# Patient Record
Sex: Male | Born: 1985 | Race: White | Hispanic: No | Marital: Married | Smoking: Never smoker
Health system: Southern US, Community
[De-identification: ages and names within clinical notes are randomized; demographics above are authoritative.]

---

## 2005-11-16 ENCOUNTER — Emergency Department: Payer: Self-pay | Admitting: Unknown Physician Specialty

## 2006-10-29 ENCOUNTER — Emergency Department: Payer: Self-pay | Admitting: Emergency Medicine

## 2009-07-08 ENCOUNTER — Emergency Department: Payer: Self-pay | Admitting: Emergency Medicine

## 2009-09-08 ENCOUNTER — Ambulatory Visit: Payer: Self-pay | Admitting: Internal Medicine

## 2011-10-10 ENCOUNTER — Emergency Department: Payer: Self-pay | Admitting: Emergency Medicine

## 2015-09-20 ENCOUNTER — Encounter: Payer: Self-pay | Admitting: Emergency Medicine

## 2015-09-20 ENCOUNTER — Ambulatory Visit
Admission: EM | Admit: 2015-09-20 | Discharge: 2015-09-20 | Disposition: A | Discharge status: Home / Self Care | Payer: Worker's Compensation | Attending: Emergency Medicine | Admitting: Emergency Medicine

## 2015-09-20 DIAGNOSIS — M62830 Muscle spasm of back: Secondary | ICD-10-CM

## 2015-09-20 DIAGNOSIS — S29012A Strain of muscle and tendon of back wall of thorax, initial encounter: Secondary | ICD-10-CM

## 2015-09-20 MED ORDER — IBUPROFEN 800 MG PO TABS: 800.0000 mg | ORAL_TABLET | Freq: Three times a day (TID) | ORAL | 0 refills | Status: DC | PRN

## 2015-09-20 MED ORDER — METAXALONE 800 MG PO TABS: 800.0000 mg | ORAL_TABLET | Freq: Three times a day (TID) | ORAL | 0 refills | Status: DC

## 2015-09-20 NOTE — ED Triage Notes (Signed)
Patient states that at work he lifted a bar weighing 50 lb. last week and felt pain in his mid back.

## 2015-09-20 NOTE — ED Provider Notes (Signed)
HPI  SUBJECTIVE:  Ethan Cobb is a 30 y.o. male who presents with left-sided nonradiating upper back pain starting 6 days ago. Describes pain as throbbing, constant. States it started while he was doing a workout for PT. States that he just finished an upper body workout with upper back rows, was bending over to pick up another heavy weight, and states that he felt something "move or pull". Symptoms are worse with leaning back, reaching forward and pulling back with his left arm. No alleviating factors. He has tried rest, heating pad, stretching and ibuprofen 400 mg without improvement. He states his shoulder is "okay". He denies numbness or tingling in his arm, grip weakness, swelling, erythema, rash or direct trauma to his neck or back. No antipyretic in the past 6-8 hours. Past medical history negative for cancer, multiple myeloma, history of injury to his neck or back, diabetes, hypertension. PMD: Forgot name. He is a Education officer, museum  History reviewed. No pertinent past medical history.  History reviewed. No pertinent surgical history.  History reviewed. No pertinent family history.  Social History  Substance Use Topics  . Smoking status: Never Smoker  . Smokeless tobacco: Never Used  . Alcohol use No    No current facility-administered medications for this encounter.   Current Outpatient Prescriptions:  .  ibuprofen (ADVIL,MOTRIN) 800 MG tablet, Take 1 tablet (800 mg total) by mouth every 8 (eight) hours as needed., Disp: 30 tablet, Rfl: 0 .  metaxalone (SKELAXIN) 800 MG tablet, Take 1 tablet (800 mg total) by mouth 3 (three) times daily., Disp: 21 tablet, Rfl: 0  No Known Allergies   ROS  As noted in HPI.   Physical Exam  BP 114/74 (BP Location: Right Arm)   Pulse 85   Temp 98.4 F (36.9 C) (Tympanic)   Resp 16   SpO2 98%   Constitutional: Well developed, well nourished, no acute distress Eyes:  EOMI, conjunctiva normal bilaterally HENT: Normocephalic,  atraumatic,mucus membranes moist Respiratory: Normal inspiratory effort Cardiovascular: Normal rate GI: nondistended. No suprapubic tenderness skin: No rash, skin intact Musculoskeletal: Positive left rhomboid tenderness, muscle spasm. No tenderness or muscle spasm along the left trapezius. No C-spine, T-spine tenderness. No scapular tenderness. No bony tenderness along the shoulder. Shoulder full range of motion intact.  grip strength, flexion and extension at elbow equal bilaterally. Brachioradialis reflexes 2+ and equal bilaterally. Cap refill less than 2 seconds. RP 2+.  Neurologic: Alert & oriented x 3, no focal neuro deficits Psychiatric: Speech and behavior appropriate   ED Course   Medications - No data to display  No orders of the defined types were placed in this encounter.   No results found for this or any previous visit (from the past 24 hour(s)). No results found.  ED Clinical Impression  Rhomboid muscle strain, initial encounter  Muscle spasm of back   ED Assessment/Plan  [place] narcotic database reviewed. Pt with no narcotic rx in the past 6 months.  No bony tenderness, no red flags on H&P. Deferring imaging at this time. Presentation is consistent with rhomboid muscle strain/sprain with some muscle spasm. Home with muscle relaxant, ibuprofen 800 mg 3 times a day with 1 g of Tylenol. Advised deep tissue massage. He declined prescription of tramadol. Follow up with Tommie Homero Fellers, FNP at [facility] health if no better in a week to 10 days. Discussed medical decision-making, and plan for follow-up with the patient.   Patient agrees with plan.   *This clinic note was created  using [employer]. Therefore, there may be occasional mistakes despite careful proofreading.  ?    Melynda Ripple, MD 09/20/15 [phone]

## 2016-07-13 ENCOUNTER — Encounter: Payer: Self-pay | Admitting: *Deleted

## 2016-07-13 ENCOUNTER — Emergency Department: Payer: 59

## 2016-07-13 ENCOUNTER — Emergency Department
Admission: EM | Admit: 2016-07-13 | Discharge: 2016-07-13 | Disposition: A | Discharge status: Home / Self Care | Payer: 59 | Attending: Emergency Medicine | Admitting: Emergency Medicine

## 2016-07-13 DIAGNOSIS — Z79899 Other long term (current) drug therapy: Secondary | ICD-10-CM | POA: Diagnosis not present

## 2016-07-13 DIAGNOSIS — M25512 Pain in left shoulder: Secondary | ICD-10-CM

## 2016-07-13 DIAGNOSIS — Y929 Unspecified place or not applicable: Secondary | ICD-10-CM | POA: Insufficient documentation

## 2016-07-13 DIAGNOSIS — Y9389 Activity, other specified: Secondary | ICD-10-CM | POA: Diagnosis not present

## 2016-07-13 DIAGNOSIS — S4992XA Unspecified injury of left shoulder and upper arm, initial encounter: Secondary | ICD-10-CM | POA: Diagnosis present

## 2016-07-13 DIAGNOSIS — Y999 Unspecified external cause status: Secondary | ICD-10-CM | POA: Insufficient documentation

## 2016-07-13 DIAGNOSIS — M75102 Unspecified rotator cuff tear or rupture of left shoulder, not specified as traumatic: Secondary | ICD-10-CM | POA: Insufficient documentation

## 2016-07-13 DIAGNOSIS — M7582 Other shoulder lesions, left shoulder: Secondary | ICD-10-CM

## 2016-07-13 DIAGNOSIS — W010XXA Fall on same level from slipping, tripping and stumbling without subsequent striking against object, initial encounter: Secondary | ICD-10-CM | POA: Diagnosis not present

## 2016-07-13 MED ORDER — IBUPROFEN 800 MG PO TABS: 800.0000 mg | ORAL_TABLET | Freq: Three times a day (TID) | ORAL | 0 refills | Status: DC | PRN

## 2016-07-13 MED ORDER — TRAMADOL HCL 50 MG PO TABS: 50.0000 mg | ORAL_TABLET | Freq: Four times a day (QID) | ORAL | 0 refills | Status: DC | PRN

## 2016-07-13 MED ORDER — KETOROLAC TROMETHAMINE 30 MG/ML IJ SOLN
30.0000 mg | Freq: Once | INTRAMUSCULAR | Status: AC
  Administered 2016-07-13: 30 mg via INTRAMUSCULAR
  Filled 2016-07-13: qty 1

## 2016-07-13 NOTE — Discharge Instructions (Signed)
Please rest and ice the left shoulder. Use a sling as needed for 2-3 days for comfort. Call orthopedics tomorrow to schedule follow-up appointment. Return to the ER for any worsening symptoms urgent changes in her health.

## 2016-07-13 NOTE — ED Provider Notes (Signed)
[facility]-EMERGENCY DEPARTMENT Provider Note   CSN: [id number] Arrival date & time: 07/13/16  2055     History   Chief Complaint Chief Complaint  Patient presents with  . Shoulder Injury    HPI Ethan Cobb is a 31 y.o. male  Presents to the emergency department for evaluation of left shoulder pain. Patient states he slipped on the grass, landed on his left lateral shoulder. Pain is located over the lateral shoulder. He felt a pop, initially had inability to abduct the shoulder but now is able to do so but with pain. He denies any head injury, neck pain. No loss of consciousness. Patient has taken Tylenol prior to arrival his pain continues to be needed at 10. No numbness or tingling in the left upper extremity. He is left-hand dominant. He is a Emergency planning/management officerpolice officer.  HPI  History reviewed. No pertinent past medical history.  There are no active problems to display for this patient.   History reviewed. No pertinent surgical history.     Home Medications    Prior to Admission medications   Medication Sig Start Date End Date Taking? Authorizing Provider  ibuprofen (ADVIL,MOTRIN) 800 MG tablet Take 1 tablet (800 mg total) by mouth every 8 (eight) hours as needed. 07/13/16   Evon SlackGaines, Shine Scrogham C, PA-C  metaxalone (SKELAXIN) 800 MG tablet Take 1 tablet (800 mg total) by mouth 3 (three) times daily. 09/20/15   Domenick GongMortenson, Ashley, MD  traMADol (ULTRAM) 50 MG tablet Take 1 tablet (50 mg total) by mouth every 6 (six) hours as needed. 07/13/16   Evon SlackGaines, Yovany Clock C, PA-C    Family History History reviewed. No pertinent family history.  Social History Social History  Substance Use Topics  . Smoking status: Never Smoker  . Smokeless tobacco: Never Used  . Alcohol use No     Allergies   Patient has no known allergies.   Review of Systems Review of Systems  Respiratory: Negative for shortness of breath.   Musculoskeletal: Positive for arthralgias. Negative for neck pain.  Skin: Negative for  rash and wound.  Neurological: Negative for dizziness, light-headedness, numbness and headaches.  Psychiatric/Behavioral: Negative for agitation.     Physical Exam Updated Vital Signs BP 135/81 (BP Location: Right Arm)   Pulse 78   Temp 98.3 F (36.8 C) (Oral)   Resp 18   Ht 5\' 7"  (1.702 m)   Wt 78.9 kg (174 lb)   SpO2 99%   BMI 27.25 kg/m   Physical Exam  Constitutional: He appears well-developed and well-nourished.  HENT:  Head: Normocephalic and atraumatic.  Neck: Normal range of motion.  Cardiovascular: Normal rate and intact distal pulses.   Pulmonary/Chest: No respiratory distress.  Musculoskeletal:  Examination of the left upper extremity shows patient has full active range of motion with abduction and flexion. He has pain with flexion and abduction greater than 90. He has painful internal and external rotation. Positive Hawkins and impingement sign. He is minimally tender along the distal clavicle.     ED Treatments / Results  Labs (all labs ordered are listed, but only abnormal results are displayed) Labs Reviewed - No data to display  EKG  EKG Interpretation None       Radiology Dg Shoulder Left  Result Date: 07/13/2016 CLINICAL DATA:  Left shoulder pain after fall while playing with children. EXAM: LEFT SHOULDER - 2+ VIEW COMPARISON:  None. FINDINGS: There is no evidence of fracture or dislocation. There is no evidence of arthropathy or other focal bone  abnormality. The adjacent included ribs and lung are unremarkable. The scapula appears intact. Soft tissues are unremarkable. IMPRESSION: Negative. Electronically Signed   By: Tollie Eth M.D.   On: 07/13/2016 21:40    Procedures Procedures (including critical care time) SPLINT APPLICATION Date/Time: 9:57 PM Authorized by: Patience Musca Consent: Verbal consent obtained. Risks and benefits: risks, benefits and alternatives were discussed Consent given by: patient Splint applied by: ED  tech Location details:  Left shoulder Splint type:  Splint sling Supplies used:  sling Post-procedure: The splinted body part was neurovascularly unchanged following the procedure. Patient tolerance: Patient tolerated the procedure well with no immediate complications.     Medications Ordered in ED Medications  ketorolac (TORADOL) 30 MG/ML injection 30 mg (30 mg Intramuscular Given 07/13/16 2152)     Initial Impression / Assessment and Plan / ED Course  I have reviewed the triage vital signs and the nursing notes.  Pertinent labs & imaging results that were available during my care of the patient were reviewed by me and considered in my medical decision making (see chart for details).      31 year old male with left shoulder pain from fall. X-ray show no evidence of acute bony abnormality or dislocation. Concern for subluxation, rotator cuff impingement. Placed into a sling for a few days, place on anti-inflammatory medications and patient follow-up with orthopedics for repeat evaluation next week. He is given a note with work restrictions.  Final Clinical Impressions(s) / ED Diagnoses   Final diagnoses:  Acute pain of left shoulder  Rotator cuff tendonitis, left    New Prescriptions New Prescriptions   IBUPROFEN (ADVIL,MOTRIN) 800 MG TABLET    Take 1 tablet (800 mg total) by mouth every 8 (eight) hours as needed.   TRAMADOL (ULTRAM) 50 MG TABLET    Take 1 tablet (50 mg total) by mouth every 6 (six) hours as needed.     Evon Slack, PA-C 07/13/16 2202    Merrily Brittle, MD 07/13/16 2223

## 2016-07-13 NOTE — ED Triage Notes (Signed)
Pt to ED reporting having fallen on left shoulder while playing with children. Pt reports haven fallen on grass. Pain reported in shoulder that increases with movement. No deformity noted. No swelling or bruising noted.

## 2017-03-07 ENCOUNTER — Ambulatory Visit: Payer: Self-pay | Admitting: Medical

## 2017-03-07 ENCOUNTER — Encounter: Payer: Self-pay | Admitting: Medical

## 2017-03-07 VITALS — BP 110/72 | HR 79 | Temp 98.6°F | Resp 18 | Ht 67.0 in | Wt 191.8 lb

## 2017-03-07 DIAGNOSIS — J0100 Acute maxillary sinusitis, unspecified: Secondary | ICD-10-CM

## 2017-03-07 DIAGNOSIS — H6691 Otitis media, unspecified, right ear: Secondary | ICD-10-CM

## 2017-03-07 MED ORDER — AMOXICILLIN-POT CLAVULANATE 875-125 MG PO TABS: 1.0000 | ORAL_TABLET | Freq: Two times a day (BID) | ORAL | 0 refills | Status: DC

## 2017-03-07 NOTE — Progress Notes (Signed)
   Subjective:    Patient ID: Ethan PimpleMatthew Cobb, male    DOB: 01/12/1986, 32 y.o.   MRN: [id number]  HPI  32 yo male in non acute distress with symptoms starting yesterday with pressure in head,  Nasal discharge green , cough productive green "alittle amount of mucus"body aches , sore throat ,  Pressure behind ears.  Has been sweating but denies fever or chills. Today took Ibuprofen 800mg   9am.   33 yo twin with vomiting Monday. Feeling better today.  Review of Systems  Constitutional: Positive for fatigue. Negative for chills and fever.  HENT: Positive for postnasal drip, rhinorrhea, sinus pressure, sinus pain, sneezing, sore throat and voice change (raspy). Negative for congestion and ear pain.   Respiratory: Positive for cough. Negative for shortness of breath.   Cardiovascular: Negative for chest pain.  Gastrointestinal: Positive for abdominal pain (central abdomen). Negative for blood in stool, constipation, diarrhea, nausea and vomiting.  Genitourinary: Negative for dysuria.  Musculoskeletal: Positive for myalgias.  Skin: Positive for rash.  Allergic/Immunologic: Negative for environmental allergies.  Neurological: Positive for headaches. Negative for dizziness, syncope and light-headedness.       Objective:   Physical Exam  Constitutional: He is oriented to person, place, and time. He appears well-developed and well-nourished.  HENT:  Head: Normocephalic and atraumatic.  Right Ear: Hearing, external ear and ear canal normal. Tympanic membrane is erythematous.  Left Ear: Hearing, external ear and ear canal normal. A middle ear effusion is present.  Nose: Mucosal edema and rhinorrhea present.  Mouth/Throat: Uvula is midline, oropharynx is clear and moist and mucous membranes are normal.  Eyes: Conjunctivae and EOM are normal. Pupils are equal, round, and reactive to light.  Neck: Normal range of motion. Neck supple.  Cardiovascular: Normal rate, regular rhythm and normal heart  sounds.  Pulmonary/Chest: Effort normal and breath sounds normal.  Lymphadenopathy:    He has cervical adenopathy.  Neurological: He is alert and oriented to person, place, and time.  Skin: Skin is warm and dry.  Psychiatric: He has a normal mood and affect. His behavior is normal. Judgment and thought content normal.  Nursing note and vitals reviewed.    Green discharge noted in right nare, erythema inside  both nares  And turbinate edema on the left side.     Assessment & Plan:  Sinusitis/ right otitis media Meds ordered this encounter  Medications  . amoxicillin-clavulanate (AUGMENTIN) 875-125 MG tablet    Sig: Take 1 tablet by mouth 2 (two) times daily.    Dispense:  20 tablet    Refill:  0  Rest , fluids , OTC Motrin take as directed.  Return in 3-5 days if not improving . Patient verbalizes understanding and has no questions at discharge.

## 2017-03-07 NOTE — Patient Instructions (Signed)
Otitis Media, Adult Otitis media is redness, soreness, and puffiness (swelling) in the space just behind your eardrum (middle ear). It may be caused by allergies or infection. It often happens along with a cold. Follow these instructions at home:  Take your medicine as told. Finish it even if you start to feel better.  Only take over-the-counter or prescription medicines for pain, discomfort, or fever as told by your doctor.  Follow up with your doctor as told. Contact a doctor if:  You have otitis media only in one ear, or bleeding from your nose, or both.  You notice a lump on your neck.  You are not getting better in 3-5 days.  You feel worse instead of better. Get help right away if:  You have pain that is not helped with medicine.  You have puffiness, redness, or pain around your ear.  You get a stiff neck.  You cannot move part of your face (paralysis).  You notice that the bone behind your ear hurts when you touch it. This information is not intended to replace advice given to you by your health care provider. Make sure you discuss any questions you have with your health care provider. Document Released: 07/12/2007 Document Revised: 07/01/2015 Document Reviewed: 08/20/2012 Elsevier Interactive Patient Education  2017 Elsevier Inc. Sinusitis, Adult Sinusitis is soreness and inflammation of your sinuses. Sinuses are hollow spaces in the bones around your face. They are located:  Around your eyes.  In the middle of your forehead.  Behind your nose.  In your cheekbones.  Your sinuses and nasal passages are lined with a stringy fluid (mucus). Mucus normally drains out of your sinuses. When your nasal tissues get inflamed or swollen, the mucus can get trapped or blocked so air cannot flow through your sinuses. This lets bacteria, viruses, and funguses grow, and that leads to infection. Follow these instructions at home: Medicines  Take, use, or apply over-the-counter  and prescription medicines only as told by your doctor. These may include nasal sprays.  If you were prescribed an antibiotic medicine, take it as told by your doctor. Do not stop taking the antibiotic even if you start to feel better. Hydrate and Humidify  Drink enough water to keep your pee (urine) clear or pale yellow.  Use a cool mist humidifier to keep the humidity level in your home above 50%.  Breathe in steam for 10-15 minutes, 3-4 times a day or as told by your doctor. You can do this in the bathroom while a hot shower is running.  Try not to spend time in cool or dry air. Rest  Rest as much as possible.  Sleep with your head raised (elevated).  Make sure to get enough sleep each night. General instructions  Put a warm, moist washcloth on your face 3-4 times a day or as told by your doctor. This will help with discomfort.  Wash your hands often with soap and water. If there is no soap and water, use hand sanitizer.  Do not smoke. Avoid being around people who are smoking (secondhand smoke).  Keep all follow-up visits as told by your doctor. This is important. Contact a doctor if:  You have a fever.  Your symptoms get worse.  Your symptoms do not get better within 10 days. Get help right away if:  You have a very bad headache.  You cannot stop throwing up (vomiting).  You have pain or swelling around your face or eyes.  You have trouble   seeing.  You feel confused.  Your neck is stiff.  You have trouble breathing. This information is not intended to replace advice given to you by your health care provider. Make sure you discuss any questions you have with your health care provider. Document Released: 07/12/2007 Document Revised: 09/19/2015 Document Reviewed: 11/18/2014 Elsevier Interactive Patient Education  2018 Elsevier Inc.  

## 2017-12-27 ENCOUNTER — Emergency Department
Admission: EM | Admit: 2017-12-27 | Discharge: 2017-12-27 | Disposition: A | Discharge status: Home / Self Care | Payer: BLUE CROSS/BLUE SHIELD | Attending: Emergency Medicine | Admitting: Emergency Medicine

## 2017-12-27 ENCOUNTER — Emergency Department: Payer: BLUE CROSS/BLUE SHIELD

## 2017-12-27 ENCOUNTER — Other Ambulatory Visit: Payer: Self-pay

## 2017-12-27 DIAGNOSIS — Y998 Other external cause status: Secondary | ICD-10-CM | POA: Diagnosis not present

## 2017-12-27 DIAGNOSIS — S79911A Unspecified injury of right hip, initial encounter: Secondary | ICD-10-CM | POA: Diagnosis present

## 2017-12-27 DIAGNOSIS — W010XXA Fall on same level from slipping, tripping and stumbling without subsequent striking against object, initial encounter: Secondary | ICD-10-CM | POA: Insufficient documentation

## 2017-12-27 DIAGNOSIS — Z79899 Other long term (current) drug therapy: Secondary | ICD-10-CM | POA: Diagnosis not present

## 2017-12-27 DIAGNOSIS — Y92019 Unspecified place in single-family (private) house as the place of occurrence of the external cause: Secondary | ICD-10-CM | POA: Diagnosis not present

## 2017-12-27 DIAGNOSIS — S7001XA Contusion of right hip, initial encounter: Secondary | ICD-10-CM | POA: Insufficient documentation

## 2017-12-27 DIAGNOSIS — Y9389 Activity, other specified: Secondary | ICD-10-CM | POA: Diagnosis not present

## 2017-12-27 MED ORDER — KETOROLAC TROMETHAMINE 30 MG/ML IJ SOLN
30.0000 mg | Freq: Once | INTRAMUSCULAR | Status: AC
  Administered 2017-12-27: 30 mg via INTRAMUSCULAR
  Filled 2017-12-27: qty 1

## 2017-12-27 MED ORDER — MELOXICAM 15 MG PO TABS: 15.0000 mg | ORAL_TABLET | Freq: Every day | ORAL | 0 refills | Status: DC

## 2017-12-27 NOTE — ED Provider Notes (Signed)
[facility] Emergency Department Provider Note  ____________________________________________  Time seen: Approximately 7:46 PM  I have reviewed the triage vital signs and the nursing notes.   HISTORY  Chief Complaint Leg Pain and Fall    HPI Ethan Cobb is a 32 y.o. male who presents emergency department complaining of right hip pain.  Patient reports that he was at his house, which was just partially renovated.  He reports that the the ER his money so they were using wooden pallets for a  walkway.  He reports that with him overnight moisture, cold temperatures a layer of ice had formed.  When walking, his leg slid out from underneath him, causing him to fall landing directly on the right hip.  Patient reports that he has been ambulatory on the hip but doing so increases his pain.  Pain is located in the lateral hip.  No groin pain.  No shortening or rotation of his leg.  Patient denies any radicular symptoms.  No knee or lower back injury.  Patient did not hit his head or lose consciousness.  No other complaints at this time.   History reviewed. No pertinent past medical history.  There are no active problems to display for this patient.   History reviewed. No pertinent surgical history.  Prior to Admission medications   Medication Sig Start Date End Date Taking? Authorizing Provider  amoxicillin-clavulanate (AUGMENTIN) 875-125 MG tablet Take 1 tablet by mouth 2 (two) times daily. 03/07/17   Ratcliffe, Heather R, PA-C  ibuprofen (ADVIL,MOTRIN) 800 MG tablet Take 1 tablet (800 mg total) by mouth every 8 (eight) hours as needed. 07/13/16   Evon Slack, PA-C  meloxicam (MOBIC) 15 MG tablet Take 1 tablet (15 mg total) by mouth daily. 12/27/17   Bryah Ocheltree, Delorise Royals, PA-C  Pseudoeph-Doxylamine-DM-APAP (NYQUIL PO) Take by mouth.    [provider]  Pseudoephedrine-APAP-DM (DAYQUIL PO) Take by mouth.    [provider]    Allergies Patient  has no known allergies.  No family history on file.  Social History Social History   Tobacco Use  . Smoking status: Never Smoker  . Smokeless tobacco: Current User    Types: Chew  Substance Use Topics  . Alcohol use: No  . Drug use: No     Review of Systems  Constitutional: No fever/chills Eyes: No visual changes. No discharge ENT: No upper respiratory complaints. Cardiovascular: no chest pain. Respiratory: no cough. No SOB. Gastrointestinal: No abdominal pain.  No nausea, no vomiting.  Musculoskeletal: Positive for right hip pain Skin: Negative for rash, abrasions, lacerations, ecchymosis. Neurological: Negative for headaches, focal weakness or numbness. 10-point ROS otherwise negative.  ____________________________________________   PHYSICAL EXAM:  VITAL SIGNS: ED Triage Vitals  Enc Vitals Group     BP 12/27/17 1745 126/69     Pulse Rate 12/27/17 1745 68     Resp 12/27/17 1745 18     Temp 12/27/17 1745 98.7 F (37.1 C)     Temp src --      SpO2 12/27/17 1745 99 %     Weight 12/27/17 1744 180 lb (81.6 kg)     Height 12/27/17 1744 5\' 7"  (1.702 m)     Head Circumference --      Peak Flow --      Pain Score 12/27/17 1743 7     Pain Loc --      Pain Edu? --      Excl. in GC? --  Constitutional: Alert and oriented. Well appearing and in no acute distress. Eyes: Conjunctivae are normal. PERRL. EOMI. Head: Atraumatic. Neck: No stridor.    Cardiovascular: Normal rate, regular rhythm. Normal S1 and S2.  Good peripheral circulation. Respiratory: Normal respiratory effort without tachypnea or retractions. Lungs CTAB. Good air entry to the bases with no decreased or absent breath sounds. Musculoskeletal: Full range of motion to all extremities. No gross deformities appreciated.  No visible abnormality to the right hip on inspection.  No shortening or rotation of the hip/right leg.  Patient is very tender to palpation of the greater trochanteric region with no  other palpable tenderness.  No palpable abnormality.  Full range of motion to the right hip.  Examination of the lumbar spine, right knee is unremarkable.  Dorsalis pedis pulse intact distally.  Sensation intact and equal bilateral lower extremities. Neurologic:  Normal speech and language. No gross focal neurologic deficits are appreciated.  Skin:  Skin is warm, dry and intact. No rash noted. Psychiatric: Mood and affect are normal. Speech and behavior are normal. Patient exhibits appropriate insight and judgement.   ____________________________________________   LABS (all labs ordered are listed, but only abnormal results are displayed)  Labs Reviewed - No data to display ____________________________________________  EKG   ____________________________________________  RADIOLOGY I personally viewed and evaluated these images as part of my medical decision making, as well as reviewing the written report by the radiologist.  I concur with radiologist finding of no acute osseous abnormality to the right hip  Dg Hip Unilat W Or Wo Pelvis 2-3 Views Right  Result Date: 12/27/2017 CLINICAL DATA:  Right leg and hip pain after fall this morning. EXAM: DG HIP (WITH OR WITHOUT PELVIS) 2-3V RIGHT COMPARISON:  None. FINDINGS: There is no evidence of hip fracture or dislocation. There is no evidence of arthropathy or other focal bone abnormality. IMPRESSION: Negative. Electronically Signed   By: Tollie Eth M.D.   On: 12/27/2017 19:43    ____________________________________________    PROCEDURES  Procedure(s) performed:    Procedures    Medications  ketorolac (TORADOL) 30 MG/ML injection 30 mg (has no administration in time range)     ____________________________________________   INITIAL IMPRESSION / ASSESSMENT AND PLAN / ED COURSE  Pertinent labs & imaging results that were available during my care of the patient were reviewed by me and considered in my medical decision making  (see chart for details).  Review of the [place] CSRS was performed in accordance of the [facility] prior to dispensing any controlled drugs.      Patient's diagnosis is consistent with right hip contusion.  Patient presents the emergency department complaining of right hip pain after a fall.  Exam was overall reassuring.  X-ray reveals no acute osseous abnormality.  Differential included fracture versus dislocation versus strain versus contusion.. Patient will be discharged home with prescriptions for meloxicam for symptom relief. Patient is to follow up with primary care or orthopedics as needed or otherwise directed. Patient is given ED precautions to return to the ED for any worsening or new symptoms.     ____________________________________________  FINAL CLINICAL IMPRESSION(S) / ED DIAGNOSES  Final diagnoses:  Contusion of right hip, initial encounter      NEW MEDICATIONS STARTED DURING THIS VISIT:  ED Discharge Orders         Ordered    meloxicam (MOBIC) 15 MG tablet  Daily     12/27/17 1950  This chart was dictated using voice recognition software/Dragon. Despite best efforts to proofread, errors can occur which can change the meaning. Any change was purely unintentional.    Lanette HampshireCuthriell, Kena Limon D, PA-C 12/27/17 2002    Sharman CheekStafford, Phillip, MD 12/28/17 [phone]

## 2017-12-27 NOTE — ED Triage Notes (Addendum)
Pt comes via POV from home with c/o right leg and hip pain. Pt states he fell this am. Pt states he slipped and fell over some iced water on steps. Pt denies LOC or hitting head.  Pt is able to put weight on leg and just states pain when ambulating. Pt states pain in upper right area.  Pt is A&OX4

## 2019-12-22 IMAGING — CR DG HIP (WITH OR WITHOUT PELVIS) 2-3V*R*
3 series · 3 of 3 positions shown · non-contrast
Comparison: None.

CLINICAL DATA: Right leg and hip pain after fall this morning.

EXAM:
DG HIP (WITH OR WITHOUT PELVIS) 2-3V RIGHT

[pelvis ap]
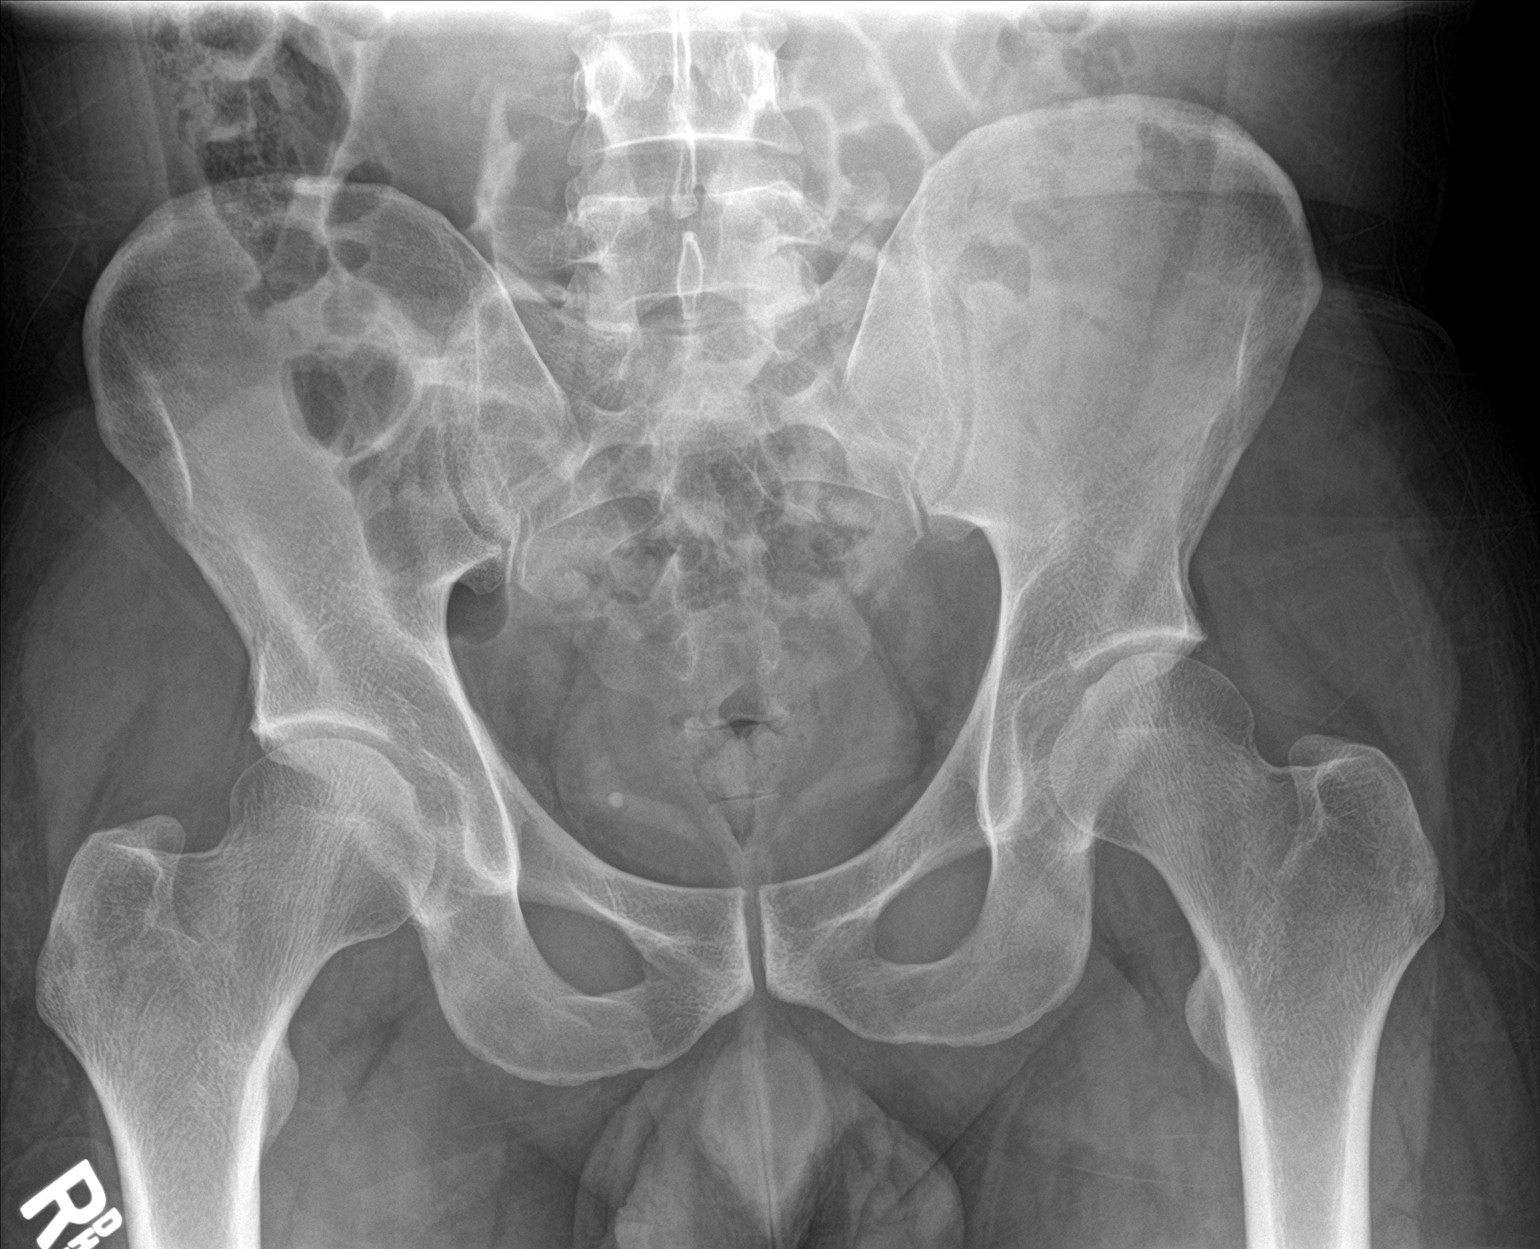

[hip ap]
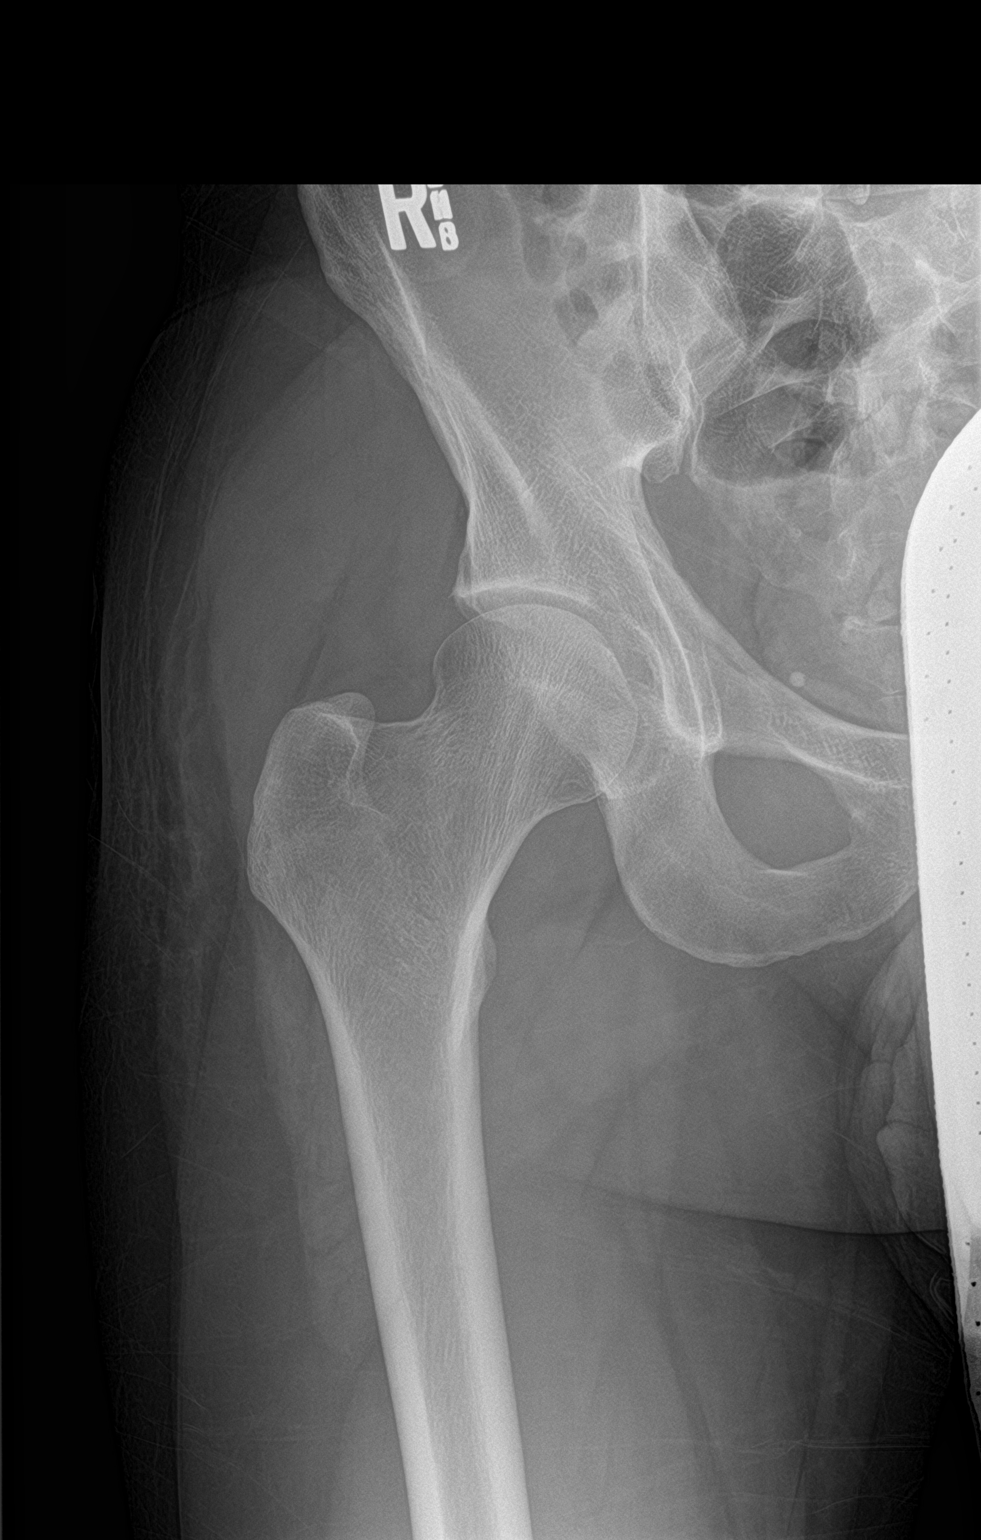

[hip lat]
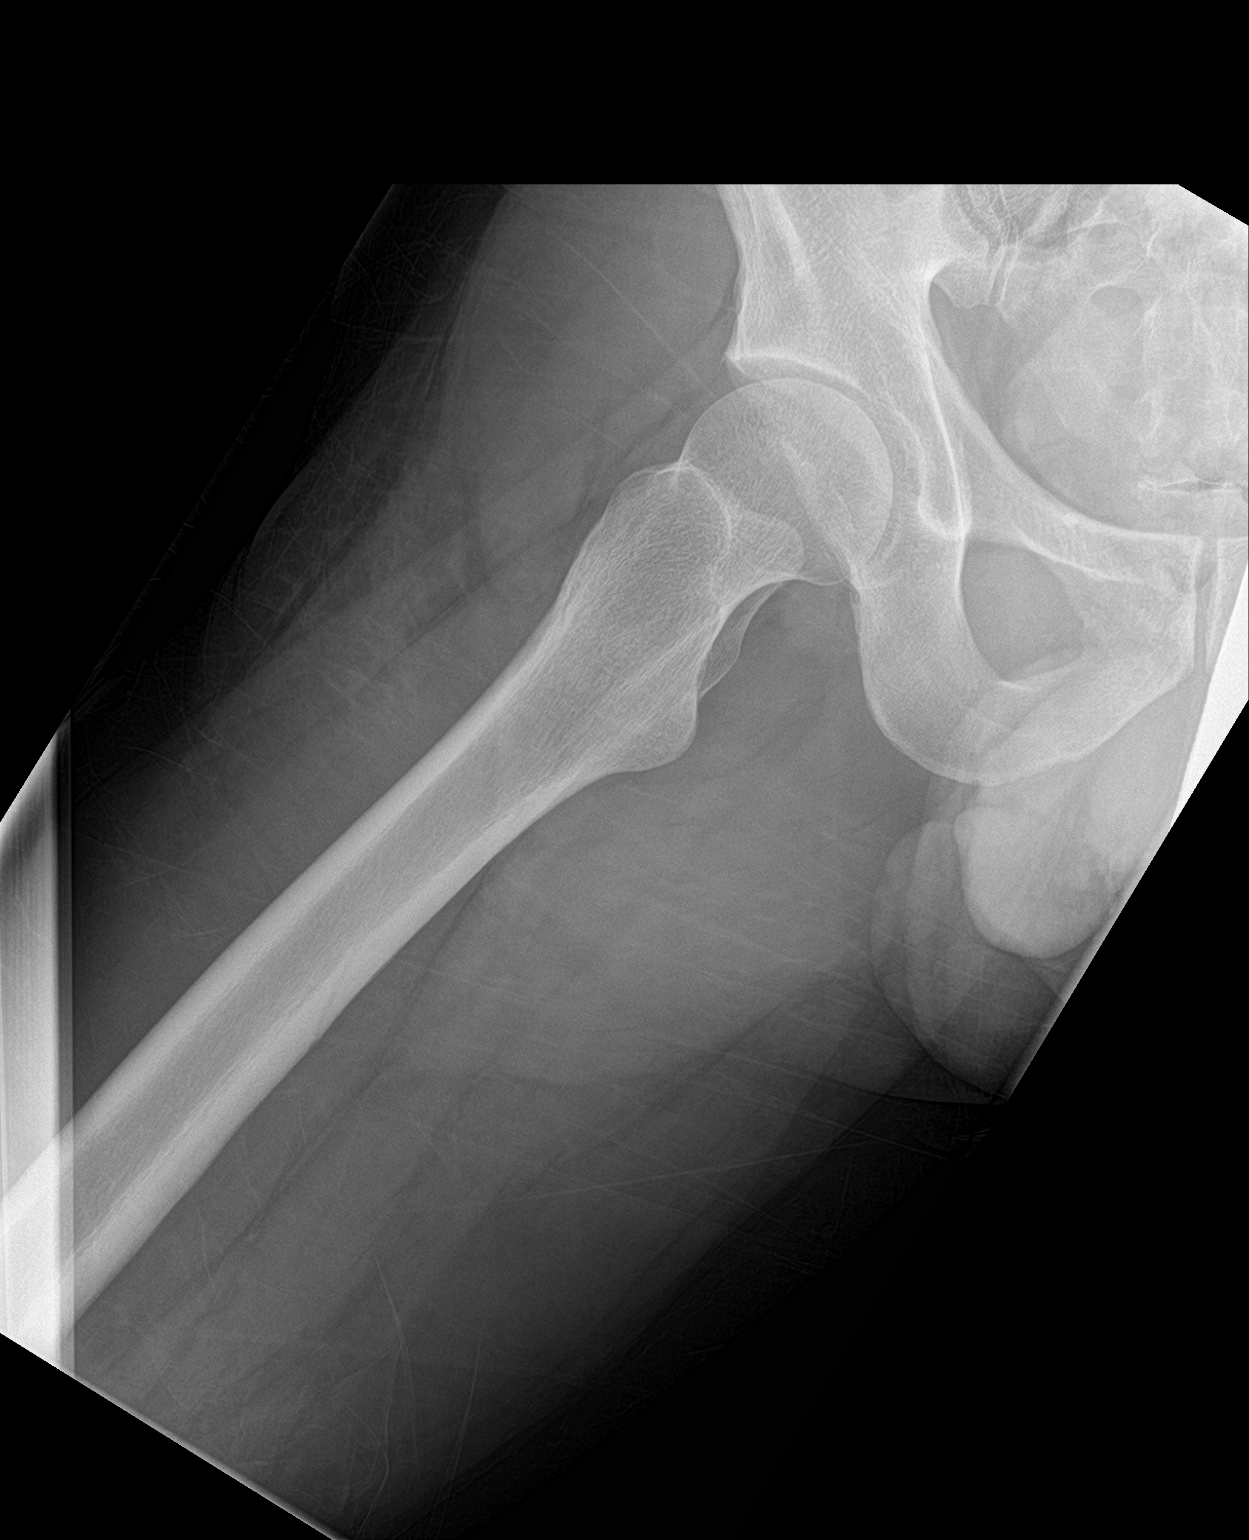

[3 of 3 positions shown; findings below may reference images not displayed]

FINDINGS: There is no evidence of hip fracture or dislocation. There is no
evidence of arthropathy or other focal bone abnormality.
IMPRESSION: Negative.

## 2023-04-09 ENCOUNTER — Other Ambulatory Visit: Payer: Self-pay

## 2023-04-09 ENCOUNTER — Encounter: Payer: Self-pay | Admitting: Oncology

## 2023-04-09 ENCOUNTER — Ambulatory Visit (INDEPENDENT_AMBULATORY_CARE_PROVIDER_SITE_OTHER): Payer: Self-pay | Admitting: Oncology

## 2023-04-09 VITALS — BP 112/68 | HR 91 | Temp 99.4°F | Ht 67.0 in | Wt 195.0 lb

## 2023-04-09 DIAGNOSIS — R051 Acute cough: Secondary | ICD-10-CM

## 2023-04-09 MED ORDER — PREDNISONE 10 MG (21) PO TBPK: ORAL_TABLET | ORAL | 0 refills | Status: DC

## 2023-04-09 MED ORDER — AZITHROMYCIN 250 MG PO TABS: ORAL_TABLET | ORAL | 0 refills | Status: AC

## 2023-04-09 NOTE — Progress Notes (Signed)
 [facility] [address] [place], [place] [address] Phone: [phone] Fax: [phone]   Office Visit Note  Patient Name: Ethan Cobb  Date of [id number]  Med Rec number [id number]  Date of Service: 04/09/2023  Patient has no known allergies.  Chief Complaint  Patient presents with   Acute Visit    Patient c/o dry cough, body aches, weakness, loss of appetite, and severe headache. Symptoms began 5 days ago. He states he had a telehealth visit this past Friday and took a flu test at home over the weekend which was positive. He was prescribed benzonatate and azelastine nasal spray from the telehealth provider which he states is not controlling the cough. He was taking Mucinex but he ran out of medication.   HPI Patient is an 38 y.o. student here for complaints of flu positive with at home testing on Friday with sx onset on Wednesday. Still having fatigue, cough, [place] and feels very weak. Body aches, HA and chills. Cough is bad all the time. Uses mucinex PM which helps him for a few hours but then he is up again.   Has been taking benzonatate, Flonase and ibuprofen without significant improvement.  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  Current Medication:  Outpatient Encounter Medications as of 04/09/2023  Medication Sig   azithromycin (ZITHROMAX) 250 MG tablet Take 2 tablets on day 1, then 1 tablet daily on days 2 through 5   benzonatate (TESSALON) 100 MG capsule Take by mouth 3 (three) times daily as needed for cough.   ibuprofen (ADVIL,MOTRIN) 800 MG tablet Take 1 tablet (800 mg total) by mouth every 8 (eight) hours as needed.   ipratropium (ATROVENT) 0.06 % nasal spray Place into both nostrils.   predniSONE (STERAPRED UNI-PAK 21 TAB) 10 MG (21) TBPK tablet Take as directed.   [DISCONTINUED] amoxicillin-clavulanate (AUGMENTIN) 875-125 MG tablet Take 1 tablet by mouth 2 (two) times daily.   [DISCONTINUED] meloxicam (MOBIC)  15 MG tablet Take 1 tablet (15 mg total) by mouth daily.   [DISCONTINUED] Pseudoeph-Doxylamine-DM-APAP (NYQUIL PO) Take by mouth.   [DISCONTINUED] Pseudoephedrine-APAP-DM (DAYQUIL PO) Take by mouth.   No facility-administered encounter medications on file as of 04/09/2023.      Medical History: History reviewed. No pertinent past medical history.   Vital Signs: BP 112/68   Pulse 91   Temp 99.4 F (37.4 C)   Ht 5\' 7"  (1.702 m)   Wt 195 lb (88.5 kg)   SpO2 96%   BMI 30.54 kg/m   ROS: As per HPI.  All other pertinent ROS negative.     Review of Systems  Constitutional:  Positive for appetite change, chills, diaphoresis, fatigue and fever.  HENT:  Positive for congestion and postnasal drip. Negative for rhinorrhea, sinus pressure, sinus pain and sore throat.   Respiratory:  Positive for cough.   Gastrointestinal:  Negative for diarrhea, nausea and vomiting.  Neurological:  Positive for headaches.  Hematological:  Negative for adenopathy.  Psychiatric/Behavioral:  Positive for sleep disturbance.     Physical Exam Constitutional:      Appearance: He is ill-appearing.  HENT:     Right Ear: A middle ear effusion is present.     Left Ear: A middle ear effusion is present.     Nose: Mucosal edema, congestion and rhinorrhea present.     Right Turbinates: Swollen.     Left Turbinates: Swollen.     Right Sinus:  No maxillary sinus tenderness or frontal sinus tenderness.     Left Sinus: No maxillary sinus tenderness or frontal sinus tenderness.     Mouth/Throat:     Mouth: Mucous membranes are moist.     Pharynx: Posterior oropharyngeal erythema and postnasal drip present.     Tonsils: No tonsillar exudate. 0 on the right. 0 on the left.  Cardiovascular:     Rate and Rhythm: Normal rate and regular rhythm.  Pulmonary:     Effort: Pulmonary effort is normal. Prolonged expiration present.     Breath sounds: Normal breath sounds.     Comments: Coughing throughout  exam Lymphadenopathy:     Head:     Right side of head: Tonsillar adenopathy present.     Left side of head: Tonsillar adenopathy present.     Cervical: Cervical adenopathy present.  Neurological:     Mental Status: He is alert.     No results found for this or any previous visit (from the past 24 hours).  Assessment/Plan: 1. Acute cough (Primary) Exam is consistent with postviral bronchitis.  We discussed treating with prednisone taper.  Take 6 tabs on day 1 followed by 5 tabs on day 2 and reduce by 1 tablet until complete.  Continue cough suppressant as needed.  May use benzonatate for Mucinex DM.  We also discussed covering him with an antibiotic given symptoms appear to be worsening and not improving.  Recommend azithromycin to 50 mg tablets.  Take 2 tabs on day 1 followed by 1 tablet until complete.  Continue Flonase 2 sprays each nostril once or twice a day along with an antihistamine such as Zyrtec or Claritin.  Please let me know if your symptoms do not improve over the next 3 to 4 days.   - benzonatate (TESSALON) 100 MG capsule; Take by mouth 3 (three) times daily as needed for cough. - ipratropium (ATROVENT) 0.06 % nasal spray; Place into both nostrils. - predniSONE (STERAPRED UNI-PAK 21 TAB) 10 MG (21) TBPK tablet; Take as directed.  Dispense: 21 tablet; Refill: 0 - azithromycin (ZITHROMAX) 250 MG tablet; Take 2 tablets on day 1, then 1 tablet daily on days 2 through 5  Dispense: 6 tablet; Refill: 0   General Counseling: jie stickels understanding of the findings of todays visit and agrees with plan of treatment. I have discussed any further diagnostic evaluation that may be needed or ordered today. We also reviewed his medications today. he has been encouraged to call the office with any questions or concerns that should arise related to todays visit.   No orders of the defined types were placed in this encounter.   Meds ordered this encounter  Medications    predniSONE (STERAPRED UNI-PAK 21 TAB) 10 MG (21) TBPK tablet    Sig: Take as directed.    Dispense:  21 tablet    Refill:  0   azithromycin (ZITHROMAX) 250 MG tablet    Sig: Take 2 tablets on day 1, then 1 tablet daily on days 2 through 5    Dispense:  6 tablet    Refill:  0    I spent 25 minutes dedicated to the care of this patient (face-to-face and non-face-to-face) on the date of the encounter to include what is described in the assessment and plan.   Durenda Hurt, NP 04/09/2023 1:36 PM

## 2023-10-01 ENCOUNTER — Encounter: Payer: Self-pay | Admitting: Medical

## 2023-10-01 ENCOUNTER — Ambulatory Visit (INDEPENDENT_AMBULATORY_CARE_PROVIDER_SITE_OTHER): Payer: Self-pay | Admitting: Medical

## 2023-10-01 ENCOUNTER — Other Ambulatory Visit: Payer: Self-pay

## 2023-10-01 VITALS — BP 111/82 | HR 78 | Temp 99.0°F | Ht 67.0 in | Wt 200.0 lb

## 2023-10-01 DIAGNOSIS — M545 Low back pain, unspecified: Secondary | ICD-10-CM

## 2023-10-01 MED ORDER — CYCLOBENZAPRINE HCL 5 MG PO TABS: 5.0000 mg | ORAL_TABLET | Freq: Every day | ORAL | 0 refills | Status: AC

## 2023-10-01 NOTE — Progress Notes (Unsigned)
 [facility] [address] [place], [place] [address]   Office Visit Note  Patient Name: Ethan Cobb Date of Birth [id number]  Medical Record number [id number]  Date of Service: 10/01/2023  Chief Complaint  Patient presents with   Back Pain    Patient began having low back pain this morning while helping his sons get ready for school. He has not taken any OTC meds.      38 y.o. male presents with bilateral lower back pain.  Began this morning as helping sons get ready for 1st day of school. Was walking toward front door of house when pain began suddenly. No unusual activities this morning or last few days. No falls or recent trauma.   Pain perhaps radiates into buttocks slightly Had not noted any numbness, tingling or weakness but endorses slight tingling to bilateral lateral thighs currently when seated on exam table. Pain worse with movement at low back. Walking more slowly, shifting weight onto one leg or other some helpful.  No abdominal pain, hematuria, fever or chills, nausea or vomiting. No loss of bowel or bladder control.   No prior injuries to low back. Hx of muscle spasms to upper back in past. Does not lift weights regularly. Works as Agricultural consultant. Working night shifts currently.  Has iced and used heating pad, heat little more helpful. No analgesic tried so far. Had difficulty sleeping during day today due to pain.  Pain 8.5/10, consistent. Pain feels like pressure. Denies significant past medical hx.   Current Medication:  Outpatient Encounter Medications as of 10/01/2023  Medication Sig   [DISCONTINUED] benzonatate (TESSALON) 100 MG capsule Take by mouth 3 (three) times daily as needed for cough. (Patient not taking: Reported on 10/01/2023)   [DISCONTINUED] ibuprofen  (ADVIL ,MOTRIN ) 800 MG tablet Take 1 tablet (800 mg total) by mouth every 8 (eight) hours as needed. (Patient not taking: Reported on 10/01/2023)   [DISCONTINUED]  ipratropium (ATROVENT) 0.06 % nasal spray Place into both nostrils. (Patient not taking: Reported on 10/01/2023)   [DISCONTINUED] predniSONE  (STERAPRED UNI-PAK 21 TAB) 10 MG (21) TBPK tablet Take as directed.   No facility-administered encounter medications on file as of 10/01/2023.      Medical History: History reviewed. No pertinent past medical history.   Vital Signs: BP 111/82   Pulse 78   Temp 99 F (37.2 C)   Ht 5' 7 (1.702 m)   Wt 200 lb (90.7 kg)   SpO2 97%   BMI 31.32 kg/m    Review of Systems  Constitutional:  Positive for activity change. Negative for chills and fever.  Gastrointestinal:  Negative for abdominal pain, nausea and vomiting.       No incontinence  Genitourinary:  Negative for dysuria, flank pain and hematuria.       No incontinence  Musculoskeletal:  Positive for back pain and gait problem. Negative for neck pain and neck stiffness.  Neurological:  Negative for dizziness and headaches.    Physical Exam Vitals reviewed.  Constitutional:      General: He is not in acute distress.    Appearance: He is not ill-appearing.  HENT:     Head: Normocephalic and atraumatic.  Musculoskeletal:     Cervical back: Normal range of motion and neck supple. No rigidity.     Lumbar back: Spasms (bilateral lumbar paraspinal muscles), tenderness (mild to bilateral lumbar paraspinal muscles) and bony tenderness (slight to lower lumbar spinous processes) present. No swelling, deformity or signs of trauma.  Comments: Mild erythema to skin of bilateral low back (patient states likely had heating pad too high, also applied ice pack directly to skin).  Strength 5/5 to bilateral LE.  Neurological:     Mental Status: He is alert.     Gait: Gait is intact.     Deep Tendon Reflexes:     Reflex Scores:      Patellar reflexes are 2+ on the right side and 2+ on the left side.      Achilles reflexes are 2+ on the right side.    Comments: Did not elicit achilles DTR on  left, suspect poor relaxation.       Assessment/Plan: 1. Acute bilateral low back pain without sciatica (Primary) *** - cyclobenzaprine  (FLEXERIL ) 5 MG tablet; Take 1-2 tablets (5-10 mg total) by mouth at bedtime.  Dispense: 30 tablet; Refill: 0     General Counseling: shamond skelton understanding of the findings of todays visit and plan of treatment. he has been encouraged to call the office with any questions or concerns that should arise related to todays visit.    Time spent:*** Minutes    Joen Arts PA-C Physician Assistant

## 2023-10-02 NOTE — Patient Instructions (Addendum)
-  Try Cyclobenzaprine  at bedtime next few days to help relax muscles. This medicine will likely cause drowsiness. -Take Ibuprofen  600-800 mg every 8 hours with food the next few days until your pain improves. -Keep moving as much as you can. Laying down or sitting too much may cause stiffness and increase discomfort. -Try some gentle stretches or get a massage once your pain improves. -Call, send message or schedule follow up visit as needed for new/worsening symptoms (i.e. increased pain, pain radiating down leg, numbness, tingling, weakness or loss of bowel/bladder control) or if symptoms not improving over next 3-5 days.

## 2024-01-21 ENCOUNTER — Encounter: Payer: Self-pay | Admitting: Medical

## 2024-01-21 ENCOUNTER — Other Ambulatory Visit: Payer: Self-pay

## 2024-01-21 ENCOUNTER — Ambulatory Visit: Payer: Self-pay | Admitting: Medical

## 2024-01-21 VITALS — BP 118/80 | HR 79 | Temp 98.5°F | Ht 67.0 in | Wt 205.0 lb

## 2024-01-21 DIAGNOSIS — J069 Acute upper respiratory infection, unspecified: Secondary | ICD-10-CM

## 2024-01-21 DIAGNOSIS — R051 Acute cough: Secondary | ICD-10-CM

## 2024-01-21 MED ORDER — PREDNISONE 10 MG PO TABS: ORAL_TABLET | ORAL | 0 refills | Status: AC

## 2024-01-21 NOTE — Progress Notes (Signed)
 [facility] [address] [place], [place] [address]   Office Visit Note  Patient Name: Ethan Cobb Date of Birth [id number]  Medical Record number [id number]  Date of Service: 01/21/2024  Chief Complaint  Patient presents with   Acute Visit    Patient c/o productive cough and sore throat. He had sinus pressure during initial onset. Denies fever but reports occasional SOB and wheezing. Symptoms began one week ago and have been getting progressively worse over time.     HPI Discussed the use of AI scribe software for clinical note transcription with the patient, who gave verbal consent to proceed.  History of Present Illness Laszlo Ellerby is a 38 year old male who presents with cough and sore throat following sinus and cold symptoms.  Cough and sore throat - Persistent cough and sore throat developed a few days ago after initial sinus symptoms resolved - Cough is sometimes productive, especially in the mornings, with whitish-green sputum - No fever, chills, or sweats - Cough occasionally leads to shortness of breath during coughing fits; Otherwise, not short of breath. - No chest pain - No nausea or vomiting  Sinus and upper respiratory symptoms - Sinus pressure and head congestion began on Monday of last week (1 week ago) and have since resolved  Gastrointestinal symptoms - Occasional diarrhea attributed to dietary factors  Ear discomfort - Occasional ear discomfort, possibly related to wind exposure at work - History of ear infections as a child and previously ruptured eardrum - No history of ear tubes  Medication use and response - Currently taking over-the-counter cold and congestion medications similar to DayQuil and NyQuil - Medications do not significantly alleviate cough - Nighttime medication helps somewhat with sleep  Respiratory history and risk factors - No history of asthma or COPD - Does not smoke or vape  Fatigue - 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 Worked last 4 days.   Current Medication:  Outpatient Encounter Medications as of 01/21/2024  Medication Sig   cyclobenzaprine  (FLEXERIL ) 5 MG tablet Take 1-2 tablets (5-10 mg total) by mouth at bedtime. (Patient taking differently: Take 5-10 mg by mouth at bedtime as needed.)   No facility-administered encounter medications on file as of 01/21/2024.      Medical History: History reviewed. No pertinent past medical history.   Vital Signs: BP 118/80   Pulse 79   Temp 98.5 F (36.9 C)   Ht 5' 7 (1.702 m)   Wt 205 lb (93 kg)   SpO2 96%   BMI 32.11 kg/m    Review of Systems See HPI  Physical Exam Vitals reviewed.  Constitutional:      General: He is not in acute distress.    Comments: Tired appearing  HENT:     Head: Normocephalic.     Right Ear: Ear canal and external ear normal.     Left Ear: Ear canal and external ear normal. Tympanic membrane is scarred.     Ears:     Comments: Small serous middle ear fluid bilaterally    Nose: Mucosal edema (slight) present. No congestion or rhinorrhea.     Right Sinus: No maxillary sinus tenderness or frontal sinus tenderness.     Left Sinus: No maxillary sinus tenderness or frontal sinus tenderness.     Mouth/Throat:     Mouth: Mucous membranes are moist. No oral lesions.     Pharynx: No pharyngeal swelling or  posterior oropharyngeal erythema.     Tonsils: No tonsillar exudate. 0 on the right. 0 on the left.  Cardiovascular:     Rate and Rhythm: Normal rate and regular rhythm.     Heart sounds: No murmur heard.    No friction rub. No gallop.  Pulmonary:     Effort: Pulmonary effort is normal.     Breath sounds: Normal breath sounds. No wheezing, rhonchi or rales.     Comments: Deep breath does not induce cough. Musculoskeletal:     Cervical back: Neck supple. No rigidity.  Lymphadenopathy:     Cervical: No cervical adenopathy.   Neurological:     Mental Status: He is alert.     Assessment/Plan: 1. Acute upper respiratory infection (Primary) 2. Acute cough  - predniSONE  (DELTASONE ) 10 MG tablet; Take 6 tablets on day 1, then 5 tablets on day 2, then 4 tablets on day 3, then 3 tablets on day 4, then 2 tablets on day 5 and 1 tablet on day 6. Take all doses with food.  Dispense: 21 tablet; Refill: 0 Assessment & Plan Acute upper respiratory infection Likely viral etiology, no antibiotics recommended at this time. Previous steroid use for similar symptoms beneficial. - Prescribed 6-day Prednisone  taper. - Recommended Mucinex DM for daytime and nighttime cough/cold medicine at bedtime. - Encouraged rest, warm liquids, honey, and cough drops. - Advised taking steroids with food. - Instructed to monitor symptoms and call/schedule follow up visit if symptoms worsen or do not improve as discussed.  Patient Instructions  -Rest and stay well hydrated (by drinking water and other liquids).  -Take steroid (Prednisone ) as prescribed with food. -Take over-the-counter medicines (i.e. Mucinex DM during day, Nyquil at bedtime) to help relieve your symptoms. -For your sore throat/cough, use cough drops/throat lozenges and/or drink warm liquids (like tea with honey). -Send MyChart message to provider, call or schedule return visit as needed for new/worsening symptoms (i.e. fever, increased shortness of breath) or if symptoms do not improve as discussed with recommended treatment over the next 5-7 days.     General Counseling: kano heckmann understanding of the findings of todays visit and plan of treatment. he has been encouraged to call the office with any questions or concerns that should arise related to todays visit.    Time spent:20 Minutes    Joen Arts PA-C Physician Assistant

## 2024-01-21 NOTE — Patient Instructions (Addendum)
-  Rest and stay well hydrated (by drinking water and other liquids).  -Take steroid (Prednisone ) as prescribed with food. -Take over-the-counter medicines (i.e. Mucinex DM during day, Nyquil at bedtime) to help relieve your symptoms. -For your sore throat/cough, use cough drops/throat lozenges and/or drink warm liquids (like tea with honey). -Send MyChart message to provider, call or schedule return visit as needed for new/worsening symptoms (i.e. fever, increased shortness of breath) or if symptoms do not improve as discussed with recommended treatment over the next 5-7 days.
